# Patient Record
Sex: Female | Born: 1955 | Race: Black or African American | Hispanic: No | Smoking: Never smoker
Health system: Southern US, Community
[De-identification: ages and names within clinical notes are randomized; demographics above are authoritative.]

## PROBLEM LIST (undated history)

## (undated) DIAGNOSIS — I10 Essential (primary) hypertension: Secondary | ICD-10-CM

## (undated) DIAGNOSIS — E7800 Pure hypercholesterolemia, unspecified: Secondary | ICD-10-CM

## (undated) HISTORY — PX: ABDOMINAL HYSTERECTOMY: SHX81

---

## 2008-01-02 ENCOUNTER — Encounter: Payer: Self-pay | Admitting: Emergency Medicine

## 2008-01-02 ENCOUNTER — Ambulatory Visit: Payer: Self-pay | Admitting: Cardiology

## 2008-01-02 ENCOUNTER — Inpatient Hospital Stay (HOSPITAL_COMMUNITY): Admission: EM | Admit: 2008-01-02 | Discharge: 2008-01-07 | Payer: Self-pay | Admitting: Internal Medicine

## 2008-01-03 ENCOUNTER — Encounter (INDEPENDENT_AMBULATORY_CARE_PROVIDER_SITE_OTHER): Payer: Self-pay | Admitting: Internal Medicine

## 2010-11-22 NOTE — Discharge Summary (Signed)
Anne Garcia, Anne Garcia              ACCOUNT NO.:  [id number]   MEDICAL RECORD NO.:  [id number]           PATIENT TYPE:   LOCATION:                                 FACILITY:   PHYSICIAN:  Beckey Rutter, MD  DATE OF BIRTH:  [birth year]   DATE OF ADMISSION:  DATE OF DISCHARGE:                               DISCHARGE SUMMARY   PRIMARY CARE PHYSICIAN:  The patient is unassigned.   CHIEF COMPLAINT AND HISTORY OF PRESENT ILLNESS:  This is a 55 years old  female admitted for accelerated blood pressure.   The patient was found to have junctional rhythm on EKG and some  shortness on exertion.  The patient was seen and evaluated by  [facility], Dr. Elsie Lincoln, who suggested ischemia workup.   The patient underwent cardiac catheterization on January 06, 2008, the  result is basically showing negative for coronary artery disease.  The  cath was done by Dr. Susa Griffins.   PLAN:  The patient now is stable for discharge to follow up with Dr.  Elsie Lincoln as discussed with her.  She is aware and agreeable to discharge  plan.   DISCHARGE MEDICATIONS:  1. Norvasc 5 mg p.o. daily.  2. Lisinopril 20 mg p.o. daily.   DISCHARGE DIAGNOSES:  1. Recent onset hypertension.  Normal renal arteries.  2. Angiographic left ventricular hypertrophy, normal left ventricle.  3. Normal coronary artery angiography.  4. Asymptomatic right bundle branch block, first degree heart block,      and episodic junctional rhythm.   Procedures are showing chest x-ray mild cardiomegaly and pulmonary  vascular congestion.  No acute finding.  This was done on January 02, 2008.  The result of the cath is as discussed above.   DISCHARGE PLAN:  The patient will be discharged today to follow up with  [facility] with Dr. Chanda Busing.      Beckey Rutter, MD  Electronically Signed    EME/MEDQ  D:  01/07/2008  T:  01/08/2008  Job:  [id number]   cc:   Madaline Savage, M.D.

## 2010-11-22 NOTE — H&P (Signed)
NAMEPHELAN, GOERS              ACCOUNT NO.:  [id number]   MEDICAL RECORD NO.:  [id number]          PATIENT TYPE:  INP   LOCATION:  3743                         FACILITY:  [facility]   PHYSICIAN:  Eduard Clos, MDDATE OF BIRTH:  September 23, 1955   DATE OF ADMISSION:  01/02/2008  DATE OF DISCHARGE:                              HISTORY & PHYSICAL   CHIEF COMPLAINT:  Referred for high blood pressure.   HISTORY OF PRESENT ILLNESS:  A 55 year old female who was previously  diagnosed with hypertension on no medication. Had gone to a health fair  where she was told that she had high blood pressure and was told to go  to the ER. At the ER in [facility], the patient was found to have high  blood pressures around 195/90 and was admitted for further workup on  uncontrolled hypertension. The patient denies any chest pain,  palpitations, dizziness, loss of consciousness, headache, weakness of  limbs, abdominal pain, nausea, vomiting, diarrhea or fever or chills or  dysuria. The patient has recently noted that when she tries to exert  herself too much she gets short of breath, particularly when she tries  to walk up stairs. Presently, the patient is not in any acute distress.   PAST MEDICAL HISTORY:  History of hypertension. Not on any medications.   PAST SURGICAL HISTORY:  Has had partial hysterectomy for fibroids.   MEDICATIONS:  Prior to admission was none.   ALLERGIES:  PENICILLIN which causes a rash.   SOCIAL HISTORY:  The patient denies smoking cigarettes, drinking alcohol  or using any illegal drugs.   FAMILY HISTORY:  Nothing contributory.   REVIEW OF SYSTEMS:  As previously stated in presenting illness. Nothing  of significance.   PHYSICAL EXAMINATION:  The patient examined bedside in no acute  distress.  VITAL SIGNS: Blood pressure 181/90, pulse 80, temperature 98.2,  respirations 18, O2 saturation 98% on room air.  HEENT: Anicteric, no pallor.  CHEST: Bilaterally quiescent.  Clear to auscultation.  HEART: S1, S2.  ABDOMEN: Soft. Nontender. Bowel sounds heard. No guarding, no rigidity.  CNS: Alert, awake, oriented to time, place, and person. Both upper and  lower extremities 5/5.  EXTREMITIES: Pedal Peripheral pulses felt. No edema.   LABORATORY DATA:  Chest x-ray shows mild cardiomegaly and pulmonary  vascular congestion, not acute findings. EKG shows accelerated  junctional rhythm with RBBB.  CBC: WBC is 4.4, hemoglobin 38.5,  hematocrit 40, platelets 163, neutrophils 57%. Complete metabolic panel:  Sodium 143, potassium 3.9, chloride 103, carbon dioxide 29, glucose 109,  BUN 17, creatinine 1. Calcium 9.8. CK-MB 1.6, troponin less 0.05. UA  ketones 15, blood negative, protein negative, glucose negative. Nitrites  and leukocytes negative.   ASSESSMENT:  1. Accelerated hypertension.  2. Accelerated junctional rhythm on EKG.  3. Shortness of breath with exertion.   PLAN:  Admit patient to team G, convert to telemetry.  Will start the  patient on lisinopril, Norvasc and will place on p.r.n. hydralazine, get  a 2D echo and will also get a cardiology evaluation for her junctional  rhythm.  Eduard Clos, MD  Electronically Signed     ANK/MEDQ  D:  01/02/2008  T:  01/02/2008  Job:  [id number]

## 2010-11-22 NOTE — Cardiovascular Report (Signed)
[name], [name]              ACCOUNT NO.:  [id number]   MEDICAL RECORD NO.:  [id number]          PATIENT TYPE:  INP   LOCATION:  3743                         FACILITY:  [facility]   PHYSICIAN:  Richard A. Alanda Amass, M.D.DATE OF BIRTH:  1956-04-05   DATE OF PROCEDURE:  01/06/2008  DATE OF DISCHARGE:                            CARDIAC CATHETERIZATION   PROCEDURE:  Retrograde central aortic catheterization, selective  coronary angiography via Judkins technique, LV angiogram RAO/LAO  projection, abdominal aortic angiogram midstream PA projection using DSA  imaging.   PROCEDURE IN DETAIL:  The patient was brought to the second floor of CP  lab in postabsorptive state after premedication with Valium 5 mg p.o.  Informed consent was obtained.  Time-out was done in the laboratory pre-  procedure.  The right groin was prepped and draped in usual manner wall.  Xylocaine 1% was used for local anesthesia and the CRFA was introduced  with a single anterior puncture using 18 thin-walled needle by Seldinger  technique.  Short sidearm sheath was inserted without difficulty and  guidewire exchange was used throughout the procedure for catheter  exchanges.  Selective coronary angiography including flexion of the  aortic cusp was done with the left and right 6-French 4-cm taper  preformed Cordis coronary and pigtail catheter was used for LV angiogram  in the RAO and LAO projection.  Pullback pressure across the aortic  valve showed no gradient.  Abdominal aortic angiogram was done above the  left renal arteries at 20 mL/second using DSA imaging.  Catheters were  removed.  Side-arm sheath was flushed.  The patient was brought to the  holding area for sheath removal and pressure hemostasis.  She tolerated  the procedure well.  She was given 2 mg of Versed for sedation at the  beginning of the procedure and she was given 10 mg of labetalol for  blood pressure reduction in the laboratory.  She was  transferred to the  holding area for sheath removal and pressure hemostasis was in stable  condition.  She tolerated the procedure well.   Pressures:  LV:  195/6; LVEDP 18-20 mmHg.   CA:  175/95 mmHg.   There is no gradient across the aortic valve on catheter pullback.  After labetalol therapy systolic pressure dropped to 160 mmHg.   There was no gradient across the aortic valve on catheter pullback.   LV angiogram in the RAO and LAO projection showed normally contracting  ventricle.  There was no mitral regurgitation present and EF was  approximately 60%.  There was angiographic LVH present.  No mitral  regurgitation or mitral valve prolapse seen.   Fluoroscopy did not reveal a coronary, intracoronary, or valvular  calcification.   The abdominal aortic angiogram revealed widely patent single normal  renal arteries bilaterally and normal SMA and celiac axis and normal  IMA.  The iliacs were widely patent throughout the common iliacs and the  hypogastrics were intact.  There was normal runoff bilaterally.   The main left coronary was long and normal.  It rose superiorly from the  left coronary cusp and other than the  superior takeoff of the normal  course.  The left anterior descending was widely patent and coursed to  the apex of the heart.  It was bifurcated with no significant narrowing  or stenosis.  There was a moderately large DX1 before the septal  perforator from the proximal LAD that was normal.   The circumflex was comprised of a large OM1 that bifurcated twice and  was normal.  Normal AV groove branch and PAVG branch that rose from the  proximal circumflex and was normal.   The right coronary was a dominant vessel that gave off a conus branch  proximally, atrial branch proximally, two RV branches, and normal PDA  and PLA.   DISCUSSION:  Anne Garcia is a very pleasant 55 year old African  American woman who works at [employer] in [employer].  She is a nonsmoker and  has  been in good health.  She attended a wellness fair and was found to be  hypertensive along with right bundle branch block and proximal  junctional rhythm and she was referred to [facility] Emergency room where she  was admitted on January 02, 2008.  She was found be hypertensive, had first-  degree heart block, and intermittent junctional rhythm with right RVEDP  and right axis deviation that was asymptomatic.  Antihypertensive  therapy was begun and baseline laboratory showed no significant  abnormality.   Cardiac catheterization shows origin of the LAD from the superior aspect  of the left coronary cusp, normal origin of the dominant RCA, normal  renal arteries, normal coronaries, and LV.  I would recommend continued  medical therapy.  She is asymptomatic.  With her rhythm disturbance, I  would avoid negative chronotropic agents for her hypertensive therapy.  If these are instituted, she might need closer followup and possibly  outpatient event monitoring if symptomatic.   CATHETERIZATION DIAGNOSES:  1. Recent onset hypertension, normal renal arteries.  2. Angiographic left ventricular hypertrophy, normal left ventricle.  3. Normal coronary arteries angiographically.  4. Asymptomatic right bundle branch block, first-degree heart block,      and episodic junctional rhythm.      Richard A. Alanda Amass, M.D.  Electronically Signed     RAW/MEDQ  D:  01/06/2008  T:  01/07/2008  Job:  [id number]   cc:   Herbie Saxon, MD  Madaline Savage, M.D.

## 2011-04-06 LAB — BASIC METABOLIC PANEL
BUN: 10
BUN: 17
CO2: 26
CO2: 27
CO2: 27
Calcium: 9.2
Calcium: 9.4
Calcium: 9.8
Chloride: 102
Chloride: 106
Chloride: 109
Creatinine, Ser: 0.81
Creatinine, Ser: 0.89
Creatinine, Ser: 0.9
Creatinine, Ser: 1
GFR calc Af Amer: >60
GFR calc Af Amer: >60
GFR calc Af Amer: >60
GFR calc Af Amer: >60
GFR calc non Af Amer: 58 — ABNORMAL LOW
GFR calc non Af Amer: >60
Glucose, Bld: 109 — ABNORMAL HIGH
Potassium: 3.5
Potassium: 3.6
Sodium: 139
Sodium: 142
Sodium: 142

## 2011-04-06 LAB — CARDIAC PANEL(CRET KIN+CKTOT+MB+TROPI)
CK, MB: 1
CK, MB: 1.3
Relative Index: INVALID
Troponin I: 0.03
Troponin I: 0.04
Troponin I: 0.05

## 2011-04-06 LAB — URINALYSIS, ROUTINE W REFLEX MICROSCOPIC
Bilirubin Urine: NEGATIVE
Nitrite: NEGATIVE
Specific Gravity, Urine: 1.022
Urobilinogen, UA: 0.2

## 2011-04-06 LAB — CBC
HCT: 38
HCT: 39.2
HCT: 40
Hemoglobin: 12.6
Hemoglobin: 13.1
Hemoglobin: 13.9
MCHC: 33.3
MCV: 83.1
MCV: 83.3
Platelets: 163
RBC: 4.57
RBC: 4.71
RBC: 4.94
RDW: 14.6
WBC: 3.8 — ABNORMAL LOW
WBC: 4.9

## 2011-04-06 LAB — LIPID PANEL
HDL: 52
Total CHOL/HDL Ratio: 2.8
Triglycerides: 62
VLDL: 12

## 2011-04-06 LAB — DIFFERENTIAL
Basophils Absolute: 0
Lymphocytes Relative: 21
Lymphs Abs: 0.9
Monocytes Relative: 19 — ABNORMAL HIGH
Neutrophils Relative %: 57

## 2011-04-06 LAB — POCT CARDIAC MARKERS: Myoglobin, poc: 41.7

## 2011-04-06 LAB — PROTIME-INR
INR: 1.1
Prothrombin Time: 14

## 2011-04-06 LAB — TSH: TSH: 3.814

## 2011-04-06 LAB — B-NATRIURETIC PEPTIDE (CONVERTED LAB): Pro B Natriuretic peptide (BNP): 321 — ABNORMAL HIGH

## 2014-12-02 ENCOUNTER — Other Ambulatory Visit: Payer: Self-pay

## 2014-12-02 ENCOUNTER — Emergency Department (HOSPITAL_BASED_OUTPATIENT_CLINIC_OR_DEPARTMENT_OTHER)
Admission: EM | Admit: 2014-12-02 | Discharge: 2014-12-02 | Disposition: A | Discharge status: Home / Self Care | Payer: 59 | Attending: Emergency Medicine | Admitting: Emergency Medicine

## 2014-12-02 ENCOUNTER — Encounter (HOSPITAL_BASED_OUTPATIENT_CLINIC_OR_DEPARTMENT_OTHER): Payer: Self-pay

## 2014-12-02 DIAGNOSIS — I10 Essential (primary) hypertension: Secondary | ICD-10-CM | POA: Insufficient documentation

## 2014-12-02 DIAGNOSIS — R51 Headache: Secondary | ICD-10-CM | POA: Insufficient documentation

## 2014-12-02 DIAGNOSIS — E780 Pure hypercholesterolemia: Secondary | ICD-10-CM | POA: Insufficient documentation

## 2014-12-02 DIAGNOSIS — Z880 Allergy status to penicillin: Secondary | ICD-10-CM | POA: Diagnosis not present

## 2014-12-02 DIAGNOSIS — R42 Dizziness and giddiness: Secondary | ICD-10-CM | POA: Diagnosis not present

## 2014-12-02 DIAGNOSIS — R519 Headache, unspecified: Secondary | ICD-10-CM

## 2014-12-02 HISTORY — DX: Essential (primary) hypertension: I10

## 2014-12-02 HISTORY — DX: Pure hypercholesterolemia, unspecified: E78.00

## 2014-12-02 MED ORDER — MECLIZINE HCL 25 MG PO TABS: 25.0000 mg | ORAL_TABLET | Freq: Three times a day (TID) | ORAL | Status: DC | PRN

## 2014-12-02 MED ORDER — MECLIZINE HCL 25 MG PO TABS
25.0000 mg | ORAL_TABLET | Freq: Once | ORAL | Status: AC
  Administered 2014-12-02: 25 mg via ORAL
  Filled 2014-12-02: qty 1

## 2014-12-02 NOTE — ED Notes (Signed)
HA, dizziness x 1 week-pt alert NAD

## 2014-12-02 NOTE — Discharge Instructions (Signed)
Take meclizine as directed for your symptoms. Follow-up with your primary care physician within the next week.  Vertigo Vertigo means you feel like you or your surroundings are moving when they are not. Vertigo can be dangerous if it occurs when you are at work, driving, or performing difficult activities.  CAUSES  Vertigo occurs when there is a conflict of signals sent to your brain from the visual and sensory systems in your body. There are many different causes of vertigo, including:  Infections, especially in the inner ear.  A bad reaction to a drug or misuse of alcohol and medicines.  Withdrawal from drugs or alcohol.  Rapidly changing positions, such as lying down or rolling over in bed.  A migraine headache.  Decreased blood flow to the brain.  Increased pressure in the brain from a head injury, infection, tumor, or bleeding. SYMPTOMS  You may feel as though the world is spinning around or you are falling to the ground. Because your balance is upset, vertigo can cause nausea and vomiting. You may have involuntary eye movements (nystagmus). DIAGNOSIS  Vertigo is usually diagnosed by physical exam. If the cause of your vertigo is unknown, your caregiver may perform imaging tests, such as an MRI scan (magnetic resonance imaging). TREATMENT  Most cases of vertigo resolve on their own, without treatment. Depending on the cause, your caregiver may prescribe certain medicines. If your vertigo is related to body position issues, your caregiver may recommend movements or procedures to correct the problem. In rare cases, if your vertigo is caused by certain inner ear problems, you may need surgery. HOME CARE INSTRUCTIONS   Follow your caregiver's instructions.  Avoid driving.  Avoid operating heavy machinery.  Avoid performing any tasks that would be dangerous to you or others during a vertigo episode.  Tell your caregiver if you notice that certain medicines seem to be causing your  vertigo. Some of the medicines used to treat vertigo episodes can actually make them worse in some people. SEEK IMMEDIATE MEDICAL CARE IF:   Your medicines do not relieve your vertigo or are making it worse.  You develop problems with talking, walking, weakness, or using your arms, hands, or legs.  You develop severe headaches.  Your nausea or vomiting continues or gets worse.  You develop visual changes.  A family member notices behavioral changes.  Your condition gets worse. MAKE SURE YOU:  Understand these instructions.  Will watch your condition.  Will get help right away if you are not doing well or get worse. Document Released: 04/05/2005 Document Revised: 09/18/2011 Document Reviewed: 01/12/2011 [facility] Patient Information 2015 [place], [place]. This information is not intended to replace advice given to you by your health care provider. Make sure you discuss any questions you have with your health care provider.

## 2014-12-02 NOTE — ED Provider Notes (Signed)
CSN: [id number]     Arrival date & time 12/02/14  1132 History   First MD Initiated Contact with Patient 12/02/14 1206     Chief Complaint  Patient presents with  . Headache     (Consider location/radiation/quality/duration/timing/severity/associated sxs/prior Treatment) HPI Comments: 59 year old female presenting with gradual onset intermittent headache 1 week. Headache is located in the posterior aspect of her head, and occasionally around her temples, described as an "annoying" pain. She saw her PCP one week ago who prescribed her Levaquin for a sinus infection, which made the headache go away for a few days, however gradually returned. States she does not feel congested in her sinuses. Pain currently 5/10, 8/10 at worst. No specific aggravating factors. States occasionally she has a lightheaded feeling when she stands up, and has felt unsteady 1 or 2 times over the past week. When she leans forward, she occasionally gets "fuzzy" vision. Currently does not feel lightheaded or unsteady. No history of migraines or headaches. Denies fever, chills, neck pain or stiffness, chest pain, shortness of breath, confusion or slurred speech. No history of heart attack or stroke.  Patient is a 59 y.o. female presenting with headaches. The history is provided by the patient.  Headache   Past Medical History  Diagnosis Date  . Hypertension   . High cholesterol    Past Surgical History  Procedure Laterality Date  . Abdominal hysterectomy     No family history on file. History  Substance Use Topics  . Smoking status: Never Smoker   . Smokeless tobacco: Not on file  . Alcohol Use: No   OB History    No data available     Review of Systems  Neurological: Positive for light-headedness and headaches.  All other systems reviewed and are negative.     Allergies  Penicillins  Home Medications   Prior to Admission medications   Medication Sig Start Date End Date Taking? Authorizing  Provider  Atorvastatin Calcium (LIPITOR PO) Take by mouth.   Yes Historical Provider, MD  LISINOPRIL PO Take by mouth.   Yes Historical Provider, MD  meclizine (ANTIVERT) 25 MG tablet Take 1 tablet (25 mg total) by mouth 3 (three) times daily as needed for dizziness. 12/02/14   [address] M Claudell Wohler, PA-C   BP 151/59 mmHg  Pulse 77  Temp(Src) 98.5 F (36.9 C)  Resp 18  Ht 5\' 5"  (1.651 m)  Wt 173 lb (78.472 kg)  BMI 28.79 kg/m2  SpO2 100% Physical Exam  Constitutional: She is oriented to person, place, and time. She appears well-developed and well-nourished. No distress.  HENT:  Head: Normocephalic and atraumatic.  Mouth/Throat: Oropharynx is clear and moist.  Eyes: Conjunctivae and EOM are normal. Pupils are equal, round, and reactive to light.  Neck: Normal range of motion. Neck supple.  No meningeal signs.  Cardiovascular: Normal rate, regular rhythm, normal heart sounds and intact distal pulses.   Pulmonary/Chest: Effort normal and breath sounds normal. No respiratory distress.  Abdominal: Soft. Bowel sounds are normal. There is no tenderness.  Musculoskeletal: Normal range of motion. She exhibits no edema.  Neurological: She is alert and oriented to person, place, and time. She has normal strength. No cranial nerve deficit or sensory deficit. Coordination and gait normal.  Speech fluent, goal oriented. No facial asymmetry. Moves limbs without ataxia. Normal gait.  Skin: Skin is warm and dry. No rash noted. She is not diaphoretic.  Psychiatric: She has a normal mood and affect. Her behavior is normal.  Nursing note and vitals reviewed.   ED Course  Procedures (including critical care time) Labs Review Labs Reviewed - No data to display  Imaging Review No results found.   EKG Interpretation   Date/Time:  Wednesday Dec 02 2014 11:44:19 EDT Ventricular Rate:  73 PR Interval:  238 QRS Duration: 126 QT Interval:  438 QTC Calculation: 482 R Axis:   91 Text Interpretation:   Sinus rhythm with 1st degree A-V block Right bundle  branch block Abnormal ECG Confirmed by Lincoln Brigham [phone]) on 12/02/2014  11:59:48 AM      MDM   Final diagnoses:  Vertigo  Occipital headache   Nontoxic appearing, NAD. AF VSS. No red flags concerning patient's headache. No sudden onset in appearance, no focal neurologic deficits or meningeal signs. Doubt meningitis, SAH, ICH or CVA. Symptoms consistent with vertigo. Symptoms completely resolved after receiving meclizine. Will discharge home with Rx for meclizine, and follow-up with PCP within one week. Stable for discharge. Return precautions given. Patient states understanding of treatment care plan and is agreeable.   Kathrynn Speed, PA-C 12/02/14 1301  Tilden Fossa, MD 12/02/14 [phone]

## 2014-12-09 ENCOUNTER — Emergency Department (HOSPITAL_BASED_OUTPATIENT_CLINIC_OR_DEPARTMENT_OTHER)
Admission: EM | Admit: 2014-12-09 | Discharge: 2014-12-09 | Disposition: A | Discharge status: Home / Self Care | Payer: 59 | Attending: Emergency Medicine | Admitting: Emergency Medicine

## 2014-12-09 ENCOUNTER — Encounter (HOSPITAL_BASED_OUTPATIENT_CLINIC_OR_DEPARTMENT_OTHER): Payer: Self-pay | Admitting: Emergency Medicine

## 2014-12-09 DIAGNOSIS — R0989 Other specified symptoms and signs involving the circulatory and respiratory systems: Secondary | ICD-10-CM | POA: Diagnosis not present

## 2014-12-09 DIAGNOSIS — Z79899 Other long term (current) drug therapy: Secondary | ICD-10-CM | POA: Insufficient documentation

## 2014-12-09 DIAGNOSIS — R42 Dizziness and giddiness: Secondary | ICD-10-CM | POA: Insufficient documentation

## 2014-12-09 DIAGNOSIS — Z880 Allergy status to penicillin: Secondary | ICD-10-CM | POA: Insufficient documentation

## 2014-12-09 DIAGNOSIS — I10 Essential (primary) hypertension: Secondary | ICD-10-CM | POA: Insufficient documentation

## 2014-12-09 DIAGNOSIS — R0981 Nasal congestion: Secondary | ICD-10-CM | POA: Diagnosis not present

## 2014-12-09 DIAGNOSIS — E780 Pure hypercholesterolemia: Secondary | ICD-10-CM | POA: Diagnosis not present

## 2014-12-09 MED ORDER — MECLIZINE HCL 50 MG PO TABS: 25.0000 mg | ORAL_TABLET | Freq: Three times a day (TID) | ORAL | Status: AC | PRN

## 2014-12-09 MED ORDER — MECLIZINE HCL 25 MG PO TABS: 25.0000 mg | ORAL_TABLET | Freq: Three times a day (TID) | ORAL | Status: AC | PRN

## 2014-12-09 NOTE — Discharge Instructions (Signed)
Dizziness Call Dr. Lucky Rathke  office to schedule an appointment if not continuing to improve by next week. If you feel back to normal by next week, no need for follow-up. Dizziness is a common problem. It is a feeling of unsteadiness or light-headedness. You may feel like you are about to faint. Dizziness can lead to injury if you stumble or fall. A person of any age group can suffer from dizziness, but dizziness is more common in older adults. CAUSES  Dizziness can be caused by many different things, including:  Middle ear problems.  Standing for too long.  Infections.  An allergic reaction.  Aging.  An emotional response to something, such as the sight of blood.  Side effects of medicines.  Tiredness.  Problems with circulation or blood pressure.  Excessive use of alcohol or medicines, or illegal drug use.  Breathing too fast (hyperventilation).  An irregular heart rhythm (arrhythmia).  A low red blood cell count (anemia).  Pregnancy.  Vomiting, diarrhea, fever, or other illnesses that cause body fluid loss (dehydration).  Diseases or conditions such as Parkinson's disease, high blood pressure (hypertension), diabetes, and thyroid problems.  Exposure to extreme heat. DIAGNOSIS  Your health care provider will ask about your symptoms, perform a physical exam, and perform an electrocardiogram (ECG) to record the electrical activity of your heart. Your health care provider may also perform other heart or blood tests to determine the cause of your dizziness. These may include:  Transthoracic echocardiogram (TTE). During echocardiography, sound waves are used to evaluate how blood flows through your heart.  Transesophageal echocardiogram (TEE).  Cardiac monitoring. This allows your health care provider to monitor your heart rate and rhythm in real time.  Holter monitor. This is a portable device that records your heartbeat and can help diagnose heart arrhythmias. It allows  your health care provider to track your heart activity for several days if needed.  Stress tests by exercise or by giving medicine that makes the heart beat faster. TREATMENT  Treatment of dizziness depends on the cause of your symptoms and can vary greatly. HOME CARE INSTRUCTIONS   Drink enough fluids to keep your urine clear or pale yellow. This is especially important in very hot weather. In older adults, it is also important in cold weather.  Take your medicine exactly as directed if your dizziness is caused by medicines. When taking blood pressure medicines, it is especially important to get up slowly.  Rise slowly from chairs and steady yourself until you feel okay.  In the morning, first sit up on the side of the bed. When you feel okay, stand slowly while holding onto something until you know your balance is fine.  Move your legs often if you need to stand in one place for a long time. Tighten and relax your muscles in your legs while standing.  Have someone stay with you for 1-2 days if dizziness continues to be a problem. Do this until you feel you are well enough to stay alone. Have the person call your health care provider if he or she notices changes in you that are concerning.  Do not drive or use heavy machinery if you feel dizzy.  Do not drink alcohol. SEEK IMMEDIATE MEDICAL CARE IF:   Your dizziness or light-headedness gets worse.  You feel nauseous or vomit.  You have problems talking, walking, or using your arms, hands, or legs.  You feel weak.  You are not thinking clearly or you have trouble forming sentences.  It may take a friend or family member to notice this.  You have chest pain, abdominal pain, shortness of breath, or sweating.  Your vision changes.  You notice any bleeding.  You have side effects from medicine that seems to be getting worse rather than better. MAKE SURE YOU:   Understand these instructions.  Will watch your condition.  Will  get help right away if you are not doing well or get worse. Document Released: 12/20/2000 Document Revised: 07/01/2013 Document Reviewed: 01/13/2011 [facility] Patient Information 2015 [place]Care, [place]. This information is not intended to replace advice given to you by your health care provider. Make sure you discuss any questions you have with your health care provider.

## 2014-12-09 NOTE — ED Provider Notes (Signed)
CSN: [id number]     Arrival date & time 12/09/14  8657 History   First MD Initiated Contact with Patient 12/09/14 1133     Chief Complaint  Patient presents with  . Dizziness     (Consider location/radiation/quality/duration/timing/severity/associated sxs/prior Treatment) HPI [place] of [place]iness i.e. sensation of off-balance or moving onset approximately 2 weeks ago. Accompanied by mild headache and feeling of pressure in her right ear as if she swimming underwater. She was seen here 12/02/2014 diagnosis vertigo prescribed meclizine which she's taken with partial relief. Her symptoms are steadily improving. She denies other associated symptoms. No visual changes. No focal numbness or weakness. No difficulty with gait. She presents here for recheck. She states her meclizine prescribed will last her for 2 more days. Past Medical History  Diagnosis Date  . Hypertension   . High cholesterol    Past Surgical History  Procedure Laterality Date  . Abdominal hysterectomy     No family history on file. History  Substance Use Topics  . Smoking status: Never Smoker   . Smokeless tobacco: Not on file  . Alcohol Use: No   OB History    No data available     Review of Systems  Constitutional: Negative.   HENT: Positive for congestion and sinus pressure.   Respiratory: Negative.   Cardiovascular: Negative.   Gastrointestinal: Negative.   Musculoskeletal: Negative.   Skin: Negative.   Neurological: Positive for dizziness.  Psychiatric/Behavioral: Negative.   All other systems reviewed and are negative.     Allergies  Penicillins  Home Medications   Prior to Admission medications   Medication Sig Start Date End Date Taking? Authorizing Provider  Atorvastatin Calcium (LIPITOR PO) Take by mouth.   Yes Historical Provider, MD  LISINOPRIL PO Take by mouth.   Yes Historical Provider, MD  meclizine (ANTIVERT) 25 MG tablet Take 1 tablet (25 mg total) by mouth 3 (three) times daily as  needed for dizziness. 12/02/14  Yes Robyn M Hess, PA-C   BP 129/59 mmHg  Pulse 75  Temp(Src) 98.1 F (36.7 C) (Oral)  Resp 18  Ht  (1.651 m)  Wt 173 lb (78.472 kg)  BMI 28.79 kg/m2  SpO2 100% Physical Exam  Constitutional: She is oriented to person, place, and time. She appears well-developed and well-nourished.  HENT:  Head: Normocephalic and atraumatic.  Right Ear: External ear normal.  Left Ear: External ear normal.  Bilateral tympanic membranes normal  Eyes: Conjunctivae are normal. Pupils are equal, round, and reactive to light.  Neck: Neck supple. No tracheal deviation present. No thyromegaly present.  Cardiovascular: Normal rate and regular rhythm.   No murmur heard. Pulmonary/Chest: Effort normal and breath sounds normal.  Abdominal: Soft. Bowel sounds are normal. She exhibits no distension. There is no tenderness.  Musculoskeletal: Normal range of motion. She exhibits no edema or tenderness.  Neurological: She is alert and oriented to person, place, and time. She has normal reflexes. No cranial nerve deficit. Coordination normal.  Gait normal Romberg normal pronator drift normal finger nose normal  Skin: Skin is warm and dry. No rash noted.  Psychiatric: She has a normal mood and affect.  Nursing note and vitals reviewed.   ED Course  Procedures (including critical care time) Labs Review Labs Reviewed - No data to display  Imaging Review No results found.   EKG Interpretation None      MDM  In light of congestion, sinus congestion, vertigo is felt to be peripheral in etiology Final  diagnoses:  None   We'll write for additional meclizine. ENT referral as needed one week Diagnosis vertigo     Doug SouSam Jamahl Lemmons, MD 12/09/14 1156

## 2014-12-09 NOTE — ED Notes (Signed)
Patient states was seen here last week and diagnosed with vertigo and given Antivert.  Patient states dizziness continues and she states she feels off balance.
